# Patient Record
Sex: Female | Born: 2000 | Race: White | Hispanic: Yes | Marital: Single | Smoking: Never smoker
Health system: Southern US, Community
[De-identification: ages and names within clinical notes are randomized; demographics above are authoritative.]

## PROBLEM LIST (undated history)

## (undated) DIAGNOSIS — T7840XA Allergy, unspecified, initial encounter: Secondary | ICD-10-CM

## (undated) HISTORY — PX: TEAR DUCT PROBING: SHX793

## (undated) HISTORY — DX: Allergy, unspecified, initial encounter: T78.40XA

## (undated) HISTORY — PX: TYMPANOSTOMY TUBE PLACEMENT: SHX32

---
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
## 2024-03-08 NOTE — Progress Notes (Signed)
 [facility] [address] [place] [address] Dept: [phone] Dept Fax: [phone]  New Patient Office Visit  Subjective:   Diamond Garner 06/15/2001 03/09/2024  Chief Complaint  Patient presents with   Establish Care   Referral    Dermatology     HPI: Romelle Sthilaire presents today to establish care at [facility] at [facility]. Introduced to Publishing rights manager role and practice setting.  All questions answered.  Concerns: See below   Discussed the use of AI scribe software for clinical note transcription with the patient, who gave verbal consent to proceed.  History of Present Illness   Karalee Hauter is a 23 year old female who presents for establishment of care and dermatology referral.  She is transitioning from her pediatrician and has a history of eczema and a childhood allergy to penicillin, though she is unsure if this allergy persists.  She has irritable bowel syndrome (IBS) with diarrhea, ongoing since the COVID pandemic. Symptoms include diarrhea after eating, regardless of food type. Omeprazole  was previously prescribed but was ineffective, leading to discontinuation. She has tried a gluten-free diet and another unspecified diet without success. Despite these symptoms, she finds her IBS manageable and is able to maintain daily activities.  She seeks a dermatology referral for a rash that appears after consuming alcohol, specifically drinks with red components like vodka cranberry. The rash, described as red dots, appears primarily on her calves and occasionally on her thighs, emerging immediately after drinking and lasting about a week, fading like bruises. The rash does not itch and has been occurring since the summer of the previous year.  She is concerned about several moles, particularly one on her left hand, left arm, and right lateral neck, due to frequent use of tanning beds and sun  exposure. One mole on her left hand occasionally becomes sore and has scabbed in the past, though it does not bleed or itch.  She is currently not taking any medications.       The following portions of the patient's history were reviewed and updated as appropriate: past medical history, past surgical history, family history, social history, allergies, medications, and problem list.   Patient Active Problem List   Diagnosis Date Noted   Irritable bowel syndrome with diarrhea 03/09/2024   Eczema 11/14/2014   Past Medical History:  Diagnosis Date   Allergy    Phreesia 04/12/2020   Past Surgical History:  Procedure Laterality Date   TEAR DUCT PROBING     TYMPANOSTOMY TUBE PLACEMENT     History reviewed. No pertinent family history. No current outpatient medications on file. Allergies  Allergen Reactions   Penicillin G Other (See Comments)    As toddler    ROS: A complete ROS was performed with pertinent positives/negatives noted in the HPI. The remainder of the ROS are negative.   Objective:   Today's Vitals   03/09/24 0918  BP: 110/80  Pulse: 72  Temp: 98.2 F (36.8 C)  TempSrc: Temporal  SpO2: 98%  Weight: 133 lb 12.8 oz (60.7 kg)  Height: 5' (1.524 m)    GENERAL: Well-appearing, in NAD. Well nourished.  SKIN: Pink, warm and dry. Nevus to dorsal surface of left hand, left upper arm, and right lateral neck  RESPIRATORY: Chest wall symmetrical. Respirations even and non-labored. Breath sounds clear to auscultation bilaterally.  CARDIAC: S1, S2 present, regular rate and rhythm. Peripheral pulses 2+ bilaterally.  EXTREMITIES: Without clubbing, cyanosis, or edema.  NEUROLOGIC:  Steady, even gait.  PSYCH/MENTAL STATUS: Alert, oriented x 3. Cooperative, appropriate mood and affect.   Health Maintenance Due  Topic Date Due   HIV Screening  Never done   Meningococcal B Vaccine (1 of 2 - Standard) Never done   Hepatitis C Screening  Never done    No results found  for any visits on 03/09/24.  Assessment & Plan:  Assessment and Plan    Irritable Bowel Syndrome with Diarrhea Chronic IBS with diarrhea. Symptoms persist despite omeprazole  and gluten-free diet. Discussed FODMAP diet to identify triggers. - Continue dietary modifications. - Avoid known trigger foods. - Consider trial of FODMAP diet.  Rash and Nonspecific Skin Eruption  Recurrent rash after consuming alcoholic mixed beverages, particularly with red ingredients. Dermatology referral made.  - Follow up with dermatology for evaluation.  Numerous Moles  Dermatology referral made for evaluation and excision. Emphasize monitoring for changes and minimizing tanning bed use. - Follow up with dermatology for evaluation and possible excision.  Eczema - stable    Orders Placed This Encounter  Procedures   Ambulatory referral to Dermatology    Referral Priority:   Routine    Referral Type:   Consultation    Referral Reason:   Specialty Services Required    Requested Specialty:   Dermatology    Number of Visits Requested:   1   No orders of the defined types were placed in this encounter.   Return in about 6 months (around 09/08/2024) for Annual Physical Exam with fasting lab work.   Gavin Kast, FNP

## 2024-03-09 ENCOUNTER — Ambulatory Visit: Admitting: Internal Medicine

## 2024-03-09 ENCOUNTER — Encounter: Payer: Self-pay | Admitting: Internal Medicine

## 2024-03-09 VITALS — BP 110/80 | HR 72 | Temp 98.2°F | Ht 60.0 in | Wt 133.8 lb

## 2024-03-09 DIAGNOSIS — R21 Rash and other nonspecific skin eruption: Secondary | ICD-10-CM

## 2024-03-09 DIAGNOSIS — K580 Irritable bowel syndrome with diarrhea: Secondary | ICD-10-CM

## 2024-03-09 DIAGNOSIS — L309 Dermatitis, unspecified: Secondary | ICD-10-CM

## 2024-03-09 DIAGNOSIS — D229 Melanocytic nevi, unspecified: Secondary | ICD-10-CM | POA: Diagnosis not present

## 2024-03-09 NOTE — Patient Instructions (Addendum)
 [facility] Dermatology Locations: [address], [place], [place] [address] Phone: [phone]   [address] [place], [place] [address] Phone: [phone]

## 2024-05-03 ENCOUNTER — Ambulatory Visit: Payer: Self-pay

## 2024-05-03 NOTE — Telephone Encounter (Signed)
 FYI Only or Action Required?: FYI only for provider.  Patient was last seen in primary care on 03/09/2024 by Billy Knee, FNP.  Called Nurse Triage reporting Back Pain.  Symptoms began today.  Interventions attempted: Rest, hydration, or home remedies.  Symptoms are: gradually worsening.  Triage Disposition: See HCP Within 4 Hours (Or PCP Triage)  Patient/caregiver understands and will follow disposition?: Yes, will follow disposition  Copied from CRM #[id number]. Topic: Clinical - Red Word Triage >> May 03, 2024  3:43 PM Deleta RAMAN wrote: Red Word that prompted transfer to Nurse Triage: patient has back pain that travels to her front just occurred on 8/6 Reason for Disposition  [1] SEVERE back pain (e.g., excruciating, unable to do any normal activities) AND [2] not improved 2 hours after pain medicine  Answer Assessment - Initial Assessment Questions 1. ONSET: When did the pain begin? (e.g., minutes, hours, days)     About an hour ago 2. LOCATION: Where does it hurt? (upper, mid or lower back)     L lower back and L side pain 3. SEVERITY: How bad is the pain?  (e.g., Scale 1-10; mild, moderate, or severe)     7 4. PATTERN: Is the pain constant? (e.g., yes, no; constant, intermittent)      constant 5. RADIATION: Does the pain shoot into your legs or somewhere else?     denies 6. CAUSE:  What do you think is causing the back pain?      denies 7. BACK OVERUSE:  Any recent lifting of heavy objects, strenuous work or exercise?     denies 8. MEDICINES: What have you taken so far for the pain? (e.g., nothing, acetaminophen, NSAIDS)     Denies having tried tylenol and ibuprofen  9. NEUROLOGIC SYMPTOMS: Do you have any weakness, numbness, or problems with bowel/bladder control?     denies 10. OTHER SYMPTOMS: Do you have any other symptoms? (e.g., fever, abdomen pain, burning with urination, blood in urine)       Denies,  11. PREGNANCY: Is there any chance you are  pregnant? When was your last menstrual period?       Possibly, LMP 7/27-7/29  Protocols used: Back Pain-A-AH

## 2024-05-04 ENCOUNTER — Encounter: Payer: Self-pay | Admitting: Family Medicine

## 2024-05-04 ENCOUNTER — Ambulatory Visit (HOSPITAL_BASED_OUTPATIENT_CLINIC_OR_DEPARTMENT_OTHER)
Admission: RE | Admit: 2024-05-04 | Discharge: 2024-05-04 | Disposition: A | Discharge status: Home / Self Care | Source: Ambulatory Visit | Attending: Family Medicine | Admitting: Family Medicine

## 2024-05-04 ENCOUNTER — Ambulatory Visit: Payer: Self-pay | Admitting: Family Medicine

## 2024-05-04 ENCOUNTER — Ambulatory Visit (INDEPENDENT_AMBULATORY_CARE_PROVIDER_SITE_OTHER): Admitting: Family Medicine

## 2024-05-04 VITALS — BP 124/86 | HR 77 | Temp 98.0°F | Resp 18 | Wt 132.2 lb

## 2024-05-04 DIAGNOSIS — R1032 Left lower quadrant pain: Secondary | ICD-10-CM | POA: Diagnosis present

## 2024-05-04 DIAGNOSIS — N200 Calculus of kidney: Secondary | ICD-10-CM | POA: Insufficient documentation

## 2024-05-04 DIAGNOSIS — M545 Low back pain, unspecified: Secondary | ICD-10-CM | POA: Insufficient documentation

## 2024-05-04 DIAGNOSIS — R399 Unspecified symptoms and signs involving the genitourinary system: Secondary | ICD-10-CM

## 2024-05-04 LAB — POC URINALSYSI DIPSTICK (AUTOMATED)
Bilirubin, UA: NEGATIVE
Glucose, UA: NEGATIVE
Leukocytes, UA: NEGATIVE
Nitrite, UA: NEGATIVE
Protein, UA: POSITIVE — AB
Spec Grav, UA: >=1.03 — AB (ref 1.010–1.025)
Urobilinogen, UA: 2 U/dL — AB
pH, UA: 6 (ref 5.0–8.0)

## 2024-05-04 LAB — POCT URINE PREGNANCY: Preg Test, Ur: NEGATIVE

## 2024-05-04 MED ORDER — TAMSULOSIN HCL 0.4 MG PO CAPS: 0.4000 mg | ORAL_CAPSULE | Freq: Every day | ORAL | 3 refills | Status: DC

## 2024-05-04 MED ORDER — IBUPROFEN 800 MG PO TABS: 800.0000 mg | ORAL_TABLET | Freq: Three times a day (TID) | ORAL | 0 refills | Status: DC | PRN

## 2024-05-04 NOTE — Patient Instructions (Signed)
  VISIT SUMMARY: Today, you visited us  due to severe left-sided back and flank pain that has been ongoing for the past two days. You initially sought care at urgent care, where you received some pain relief and were prescribed medications. We discussed your symptoms and performed a urine test, which showed blood but no signs of infection. We suspect that you may have a kidney stone causing your pain.  YOUR PLAN: -LEFT FLANK AND BACK PAIN, POSSIBLE NEPHROLITHIASIS WITH HEMATURIA: Your severe left-sided back and flank pain is likely due to nephrolithiasis, which means you may have a kidney stone. Kidney stones can cause significant pain and blood in the urine. We have ordered a CT scan of your abdomen and pelvis to confirm this. In the meantime, we have prescribed tamsulosin  to help the stone pass, ibuprofen  for pain relief, and hydrocodone for severe pain. Please increase your fluid intake to help pass the stone. We have also provided a work note for five days off to allow you to recover. If your symptoms persist beyond five days or worsen, please follow up with us .  INSTRUCTIONS: Please get a CT scan of your abdomen and pelvis as soon as possible to check for kidney stones. Take tamsulosin  0.4 mg once daily to help pass the stone, and use ibuprofen  800 mg every 8 hours as needed for pain. Continue taking hydrocodone for severe pain if necessary. Drink plenty of fluids to aid in stone passage. Rest and take the next five days off work as per the work note provided. Follow up with us  if your symptoms persist beyond five days or if your condition worsens.

## 2024-05-04 NOTE — Progress Notes (Signed)
 Assessment & Plan   Assessment/Plan:    Assessment & Plan Left flank and back pain, possible nephrolithiasis with hematuria Acute left-sided flank and back pain for two days, likely due to nephrolithiasis. Hematuria with 2+ red blood cells in urine, no esterase or nitrites, suggests non-infectious etiology. Differential diagnosis includes nephrolithiasis, given hematuria and characteristic pain. Pain is severe, requiring hydrocodone. Urine pregnancy test is negative, ruling out pregnancy-related causes. No fever, vomiting, or other signs of infection. - Order CT scan of the abdomen and pelvis to evaluate for nephrolithiasis. - Prescribe tamsulosin  0.4 mg once daily to facilitate stone passage. - Encourage increased fluid intake to aid in stone passage. - Prescribe ibuprofen  800 mg every 8 hours as needed for pain. - Continue hydrocodone for severe pain management as needed. - Provide a work note for five days off to allow for recovery and stone passage. - Advise follow-up if symptoms persist beyond five days or if condition worsens.      There are no discontinued medications.  Return if symptoms worsen or fail to improve.        Subjective:   Encounter date: 05/04/2024  Diamond Garner is a 23 y.o. female who has Eczema; Irritable bowel syndrome with diarrhea; Acute left-sided low back pain without sciatica; and Urinary tract infection symptoms on their problem list..   She  has a past medical history of Allergy..   She presents with chief complaint of Nephrolithiasis (Pt c/o left side pain and back pain. Pt was prescribed hydrocodone and zofran for symptoms while at Urgent Care on 05/03/2024//HM due- vaccinations. ) .   Discussed the use of AI scribe software for clinical note transcription with the patient, who gave verbal consent to proceed.  History of Present Illness Diamond Garner is a 23 year old female who presents with left-sided back and flank pain. She is  accompanied by her mother.  She has been experiencing left-sided back and flank pain for the past two days. The pain began after work and was initially mild but worsened significantly, prompting her to visit urgent care. She describes the pain as severe, requiring her to lie down with a heating pad and eventually seek medical attention.  At urgent care, she received an injection, possibly of lidocaine and ketorolac, which provided some relief. She was prescribed hydrocodone for pain and Zofran for nausea. She took her first dose of hydrocodone around 6 or 7 PM, which helped reduce the pain but did not eliminate it. She woke up at 4 AM the following morning with pain similar to the initial onset and took another dose of hydrocodone. She has not taken any more pain medication since 4 AM.  A urine test at urgent care showed 2+ red blood cells but no esterase or nitrites. A urine pregnancy test was negative. She reports nausea, which she attributes to straining from the pain. No fever, vomiting, vaginal bleeding, discharge, blood in stool, or constipation.   Patient's last menstrual period was 04/23/2024 (exact date).   ROS  Past Surgical History:  Procedure Laterality Date   TEAR DUCT PROBING     TYMPANOSTOMY TUBE PLACEMENT      Outpatient Medications Prior to Visit  Medication Sig Dispense Refill   HYDROcodone-acetaminophen (NORCO/VICODIN) 5-325 MG tablet Take 1 tablet by mouth every 6 (six) hours as needed for moderate pain (pain score 4-6).     ondansetron (ZOFRAN-ODT) 4 MG disintegrating tablet Take 2 mg by mouth every 6 (six) hours as needed.  No facility-administered medications prior to visit.    Family History  Problem Relation Age of Onset   Anxiety disorder Maternal Grandmother    Arthritis Maternal Grandmother     Social History   Socioeconomic History   Marital status: Single    Spouse name: Not on file   Number of children: Not on file   Years of education: Not on  file   Highest education level: Some college, no degree  Occupational History   Not on file  Tobacco Use   Smoking status: Never   Smokeless tobacco: Never  Substance and Sexual Activity   Alcohol use: Not on file   Drug use: Not on file   Sexual activity: Not on file  Other Topics Concern   Not on file  Social History Narrative   11th grade at [employer] 21-22 school year. Lives with mom, step-dad, older sister, 2 dogs, 1 cat (mostly outdoor)   Social Drivers of Corporate investment banker Strain: Low Risk  (03/05/2024)   Overall Financial Resource Strain (CARDIA)    Difficulty of Paying Living Expenses: Not very hard  Food Insecurity: No Food Insecurity (03/05/2024)   Hunger Vital Sign    Worried About Running Out of Food in the Last Year: Never true    Ran Out of Food in the Last Year: Never true  Transportation Needs: No Transportation Needs (03/05/2024)   PRAPARE - Administrator, Civil Service (Medical): No    Lack of Transportation (Non-Medical): No  Physical Activity: Sufficiently Active (03/05/2024)   Exercise Vital Sign    Days of Exercise per Week: 5 days    Minutes of Exercise per Session: 60 min  Stress: No Stress Concern Present (03/05/2024)   [employer] of Occupational Health - Occupational Stress Questionnaire    Feeling of Stress : Only a little  Social Connections: Unknown (03/05/2024)   Social Connection and Isolation Panel    Frequency of Communication with Friends and Family: More than three times a week    Frequency of Social Gatherings with Friends and Family: Three times a week    Attends Religious Services: More than 4 times per year    Active Member of Clubs or Organizations: Yes    Attends Engineer, structural: More than 4 times per year    Marital Status: Patient declined  Intimate Partner Violence: Not on file                                                                                                   Objective:  Physical Exam: BP 124/86 (BP Location: Left Arm, Patient Position: Sitting, Cuff Size: Normal)   Pulse 77   Temp 98 F (36.7 C) (Temporal)   Resp 18   Wt 132 lb 3.2 oz (60 kg)   LMP 04/23/2024 (Exact Date)   SpO2 100%   BMI 25.82 kg/m   Wt Readings from Last 3 Encounters:  05/04/24 132 lb 3.2 oz (60 kg)  03/09/24 133 lb 12.8 oz (60.7 kg)  01/13/21 136 lb (61.7 kg) (72%, Z=  0.59)*   * Growth percentiles are based on CDC (Girls, 2-20 Years) data.    Physical Exam GENERAL: Alert, cooperative, well developed, no acute distress. HEENT: Normocephalic, normal oropharynx, moist mucous membranes. CHEST: Clear to auscultation bilaterally, no wheezes, rhonchi, or crackles. CARDIOVASCULAR: Normal heart rate and rhythm, S1 and S2 normal without murmurs. ABDOMEN: Soft, non-tender, non-distended, without organomegaly, normal bowel sounds, no costovertebral angle tenderness. EXTREMITIES: No cyanosis or edema. NEUROLOGICAL: Cranial nerves grossly intact, moves all extremities without gross motor or sensory deficit.   Physical Exam  No results found.  Recent Results (from the past 2160 hours)  POCT urine pregnancy     Status: Normal   Collection Time: 05/04/24 10:49 AM  Result Value Ref Range   Preg Test, Ur Negative Negative  POCT Urinalysis Dipstick (Automated)     Status: Abnormal   Collection Time: 05/04/24 10:50 AM  Result Value Ref Range   Color, UA yellow    Clarity, UA clear    Glucose, UA Negative Negative   Bilirubin, UA NEGATIVE    Ketones, UA POSTIVE    Spec Grav, UA >=1.030 (A) 1.010 - 1.025   Blood, UA POSTIVE    pH, UA 6.0 5.0 - 8.0   Protein, UA Positive (A) Negative   Urobilinogen, UA 2.0 (A) 0.2 or 1.0 E.U./dL   Nitrite, UA NEGATIVE    Leukocytes, UA Negative Negative        Beverley Adine Hummer, MD, MS

## 2024-05-05 ENCOUNTER — Ambulatory Visit: Admitting: Internal Medicine

## 2024-05-05 LAB — URINALYSIS, ROUTINE W REFLEX MICROSCOPIC
Bilirubin Urine: NEGATIVE
Leukocytes,Ua: NEGATIVE
Nitrite: NEGATIVE
Specific Gravity, Urine: 1.025 (ref 1.000–1.030)
Urine Glucose: NEGATIVE
Urobilinogen, UA: 0.2 (ref 0.0–1.0)
pH: 6 (ref 5.0–8.0)

## 2024-05-05 LAB — URINE CULTURE
MICRO NUMBER:: 16800877
SPECIMEN QUALITY:: ADEQUATE

## 2024-05-09 ENCOUNTER — Telehealth: Payer: Self-pay

## 2024-05-09 DIAGNOSIS — N200 Calculus of kidney: Secondary | ICD-10-CM

## 2024-05-09 NOTE — Telephone Encounter (Signed)
 Please advise   Copied from CRM [id number]. Topic: Clinical - Medical Advice >> May 09, 2024  1:52 PM Diamond Garner wrote: Reason for CRM: Patient would  Like to know what would be the next steps as far as the kidney stone that she has.She don't think she has passed it yet.She said she has some pain here and there but nothing extremely painful.

## 2024-05-10 NOTE — Addendum Note (Signed)
 Addended by: SEBASTIAN RIGHTER B on: 05/10/2024 01:20 PM   Modules accepted: Orders

## 2024-05-11 NOTE — Telephone Encounter (Signed)
 Noted. MyChart message was sent

## 2024-05-16 ENCOUNTER — Encounter: Payer: Self-pay | Admitting: Internal Medicine

## 2024-05-16 ENCOUNTER — Ambulatory Visit (INDEPENDENT_AMBULATORY_CARE_PROVIDER_SITE_OTHER): Admitting: Internal Medicine

## 2024-05-16 VITALS — BP 106/72 | HR 60 | Temp 98.4°F | Ht 60.0 in | Wt 132.4 lb

## 2024-05-16 DIAGNOSIS — F419 Anxiety disorder, unspecified: Secondary | ICD-10-CM | POA: Diagnosis not present

## 2024-05-16 DIAGNOSIS — Z0000 Encounter for general adult medical examination without abnormal findings: Secondary | ICD-10-CM | POA: Diagnosis not present

## 2024-05-16 DIAGNOSIS — L709 Acne, unspecified: Secondary | ICD-10-CM | POA: Diagnosis not present

## 2024-05-16 DIAGNOSIS — Z1329 Encounter for screening for other suspected endocrine disorder: Secondary | ICD-10-CM | POA: Diagnosis not present

## 2024-05-16 DIAGNOSIS — F32A Depression, unspecified: Secondary | ICD-10-CM | POA: Diagnosis not present

## 2024-05-16 DIAGNOSIS — Z13220 Encounter for screening for lipoid disorders: Secondary | ICD-10-CM

## 2024-05-16 LAB — LIPID PANEL
Cholesterol: 151 mg/dL (ref 0–200)
HDL: 62 mg/dL (ref >39.00)
LDL Cholesterol: 81 mg/dL (ref 0–99)
NonHDL: 88.73
Total CHOL/HDL Ratio: 2
Triglycerides: 38 mg/dL (ref 0.0–149.0)
VLDL: 7.6 mg/dL (ref 0.0–40.0)

## 2024-05-16 LAB — BASIC METABOLIC PANEL WITH GFR
BUN: 16 mg/dL (ref 6–23)
CO2: 24 meq/L (ref 19–32)
Calcium: 9.7 mg/dL (ref 8.4–10.5)
Chloride: 101 meq/L (ref 96–112)
Creatinine, Ser: 0.67 mg/dL (ref 0.40–1.20)
GFR: 125.53 mL/min (ref >60.00)
Glucose, Bld: 68 mg/dL — ABNORMAL LOW (ref 70–99)
Potassium: 3.9 meq/L (ref 3.5–5.1)
Sodium: 135 meq/L (ref 135–145)

## 2024-05-16 LAB — TSH: TSH: 0.26 u[IU]/mL — ABNORMAL LOW (ref 0.35–5.50)

## 2024-05-16 MED ORDER — SERTRALINE HCL 25 MG PO TABS: 25.0000 mg | ORAL_TABLET | Freq: Every day | ORAL | 0 refills | Status: DC

## 2024-05-16 MED ORDER — TRETINOIN 0.025 % EX CREA: TOPICAL_CREAM | Freq: Every day | CUTANEOUS | 0 refills | Status: DC

## 2024-05-16 NOTE — Progress Notes (Signed)
 Subjective:   Diamond Garner April 26, 2001  05/16/2024   CC: Chief Complaint  Patient presents with   Annual Exam    Fasting     HPI: Diamond Garner is a 23 y.o. female who presents for a routine health maintenance exam.  Labs  collected at time of visit.  She is going to school at [employer] for psychology, starting junior year.    ACNE: Duration: years Acne problem areas:forehead, chin, and cheeks  Worst area:cheeks  Aggravating factors: stress and menses Current face care: using plain soap bar Current acne medications: none Previous acne medications: none Acne status: exacerbated Previous dermatology evaluation: no   ANXIETY: Diamond Garner presents to discuss anxiety.  Has noticed more recently with working and starting going back to school. Reports noticing no motivation. Becomes sad and lonely. Lives with sister, but does not see her frequently. Reports trouble sleeping -falling asleep and then sleeping too much. No change in appetite.  Does report having panic attacks - none recently, but has occurred in past.  Current medication regimen: none  Counseling: no Well controlled: no Denies SI/HI.      05/16/2024    1:10 PM 03/09/2024    9:34 AM  GAD 7 : Generalized Anxiety Score  Nervous, Anxious, on Edge 3 2  Control/stop worrying 2 2  Worry too much - different things 2 2  Trouble relaxing 0 0  Restless 0 0  Easily annoyed or irritable 1 0  Afraid - awful might happen 1 1  Total GAD 7 Score 9 7  Anxiety Difficulty Very difficult Somewhat difficult      05/16/2024    1:10 PM 05/04/2024   10:48 AM 03/09/2024    9:34 AM  PHQ9 SCORE ONLY  PHQ-9 Total Score 3 0 2     HEALTH SCREENINGS: - Pap smear: not applicable - Mammogram (40+): Not applicable  - Colonoscopy (45+): Not applicable  - Bone Density (65+): Not applicable  - Lung CA screening with low-dose CT:  Not applicable Adults age 3-80 who are current cigarette smokers or quit within the last  15 years. Must have 20 pack year history.   IMMUNIZATIONS: - Tdap: Tetanus vaccination status reviewed: last tetanus booster within 10 years. - HPV: Up to date - Influenza: Postponed to flu season - Prevnar 20: Not applicable - Zostavax (50+): Not applicable   Past medical history, surgical history, medications, allergies, family history and social history reviewed with patient today and changes made to appropriate areas of the chart.   Social History   Socioeconomic History   Marital status: Single    Spouse name: Not on file   Number of children: Not on file   Years of education: Not on file   Highest education level: Some college, no degree  Occupational History   Not on file  Tobacco Use   Smoking status: Never   Smokeless tobacco: Never  Vaping Use   Vaping status: Never Used  Substance and Sexual Activity   Alcohol use: Yes    Comment: occ   Drug use: Never   Sexual activity: Never  Other Topics Concern   Not on file  Social History Narrative   11th grade at [employer] 21-22 school year. Lives with mom, step-dad, older sister, 2 dogs, 1 cat (mostly outdoor)   Social Drivers of Corporate investment banker Strain: Low Risk  (05/15/2024)   Overall Financial Resource Strain (CARDIA)    Difficulty of Paying Living Expenses: Not  very hard  Food Insecurity: No Food Insecurity (05/15/2024)   Hunger Vital Sign    Worried About Running Out of Food in the Last Year: Never true    Ran Out of Food in the Last Year: Never true  Transportation Needs: No Transportation Needs (05/15/2024)   PRAPARE - Administrator, Civil Service (Medical): No    Lack of Transportation (Non-Medical): No  Physical Activity: Unknown (05/15/2024)   Exercise Vital Sign    Days of Exercise per Week: 5 days    Minutes of Exercise per Session: Patient declined  Stress: Stress Concern Present (05/15/2024)   [employer] of Occupational Health - Occupational Stress  Questionnaire    Feeling of Stress: Rather much  Social Connections: Unknown (05/15/2024)   Social Connection and Isolation Panel    Frequency of Communication with Friends and Family: More than three times a week    Frequency of Social Gatherings with Friends and Family: Twice a week    Attends Religious Services: More than 4 times per year    Active Member of [employer] or Organizations: Yes    Attends Banker Meetings: More than 4 times per year    Marital Status: Patient declined  Intimate Partner Violence: Not on file     Past Medical History:  Diagnosis Date   Allergy    Phreesia 04/12/2020    Past Surgical History:  Procedure Laterality Date   TEAR DUCT PROBING     TYMPANOSTOMY TUBE PLACEMENT      Current Outpatient Medications on File Prior to Visit  Medication Sig   ibuprofen  (ADVIL ) 800 MG tablet Take 1 tablet (800 mg total) by mouth every 8 (eight) hours as needed.   ondansetron (ZOFRAN-ODT) 4 MG disintegrating tablet Take 2 mg by mouth every 6 (six) hours as needed.   No current facility-administered medications on file prior to visit.    Allergies  Allergen Reactions   Penicillin G Other (See Comments)    As toddler    Family History  Problem Relation Age of Onset   Anxiety disorder Maternal Grandmother    Arthritis Maternal Grandmother      ROS: Denies fever, fatigue, unexplained weight loss/gain, hearing or vision changes, cardiac or respiratory complaints. Denies neurological deficits, musculoskeletal complaints, gastrointestinal or genitourinary complaints.  Objective:   Today's Vitals   05/16/24 1308  BP: 106/72  Pulse: 60  Temp: 98.4 F (36.9 C)  TempSrc: Temporal  SpO2: 97%  Weight: 132 lb 6.4 oz (60.1 kg)  Height: 5' (1.524 m)    GENERAL APPEARANCE: Well-appearing, in NAD. Well nourished.  SKIN: Pink, warm and dry. Turgor normal. No rash, lesion, ulceration, or ecchymoses. Hair evenly distributed. Open and closed comedones to  chin, cheeks, forehead HEENT: HEAD: Normocephalic.  EYES: PERRLA. EOMI. Lids intact w/o defect. Sclera white, Conjunctiva pink w/o exudate.  EARS: External ear w/o redness, swelling, masses or lesions. EAC clear. TM's intact, translucent w/o bulging, appropriate landmarks visualized. Appropriate acuity to conversational tones.  NOSE: Septum midline w/o deformity. Nares patent, mucosa pink and non-inflamed w/o drainage.  THROAT: Uvula midline. Oropharynx clear. Tonsils non-inflamed w/o exudate . Oral mucosa pink and moist.  NECK: Supple, Trachea midline. Full ROM w/o pain or tenderness. No lymphadenopathy. Thyroid  non-tender w/o enlargement or palpable masses.  BREASTS: Breasts pendulous, symmetrical, and w/o palpable masses. Nipples everted and w/o discharge. No rash or skin retraction. No axillary or supraclavicular lymphadenopathy.  RESPIRATORY: Chest wall symmetrical w/o masses. Respirations even and non-labored.  Breath sounds clear to auscultation bilaterally. No wheezes, rales, rhonchi, or crackles. CARDIAC: S1, S2 present, regular rate and rhythm. No gallops, murmurs, rubs, or clicks. Capillary refill <2 seconds. Peripheral pulses 2+ bilaterally. GI: Abdomen soft w/o distention. Normoactive bowel sounds. No palpable masses or tenderness. No guarding or rebound tenderness. Liver and spleen w/o tenderness or enlargement. No CVA tenderness.  MSK: Muscle tone and strength appropriate for age, w/o atrophy or abnormal movement.  EXTREMITIES: Active ROM intact, w/o tenderness, crepitus, or contracture. No obvious joint deformities or effusions. No clubbing, edema, or cyanosis.  NEUROLOGIC: CN's II-XII intact. Motor strength symmetrical with no obvious weakness. No sensory deficits. Steady, even gait.  PSYCH/MENTAL STATUS: Alert, oriented x 3. Cooperative, appropriate mood and affect.    Depression and Anxiety Screen done today and results listed below:     05/16/2024    1:10 PM 05/04/2024   10:48  AM 03/09/2024    9:34 AM  Depression screen PHQ 2/9  Decreased Interest 0 0 0  Down, Depressed, Hopeless 1 0 0  PHQ - 2 Score 1 0 0  Altered sleeping 1  1  Tired, decreased energy 1  1  Change in appetite 0  0  Feeling bad or failure about yourself  0  0  Trouble concentrating 0  0  Moving slowly or fidgety/restless 0  0  Suicidal thoughts 0  0  PHQ-9 Score 3  2  Difficult doing work/chores Somewhat difficult  Not difficult at all      05/16/2024    1:10 PM 03/09/2024    9:34 AM  GAD 7 : Generalized Anxiety Score  Nervous, Anxious, on Edge 3 2  Control/stop worrying 2 2  Worry too much - different things 2 2  Trouble relaxing 0 0  Restless 0 0  Easily annoyed or irritable 1 0  Afraid - awful might happen 1 1  Total GAD 7 Score 9 7  Anxiety Difficulty Very difficult Somewhat difficult      Assessment & Plan:  Annual physical exam  Screening for thyroid  disorder -     TSH -     Basic metabolic panel with GFR  Screening for lipid disorders -     Lipid panel  Acne, unspecified acne type -     Tretinoin ; Apply topically at bedtime.  Dispense: 45 g; Refill: 0 - discussed starting benzoyl peroxide wash  Anxiety and depression -     Sertraline  HCl; Take 1 tablet (25 mg total) by mouth daily.  Dispense: 90 tablet; Refill: 0 - will increase based on tolerance and response to medication    Orders Placed This Encounter  Procedures   TSH   Basic Metabolic Panel (BMET)   Lipid panel    PATIENT COUNSELING:  - Encouraged a healthy well-balanced diet. Patient may adjust caloric intake to maintain or achieve ideal body weight. May reduce intake of dietary saturated fat and total fat and have adequate dietary potassium and calcium preferably from fresh fruits, vegetables, and low-fat dairy products.    - Stressed the importance of regular exercise   NEXT PREVENTATIVE PHYSICAL DUE IN 1 YEAR.  Return in about 4 weeks (around 06/13/2024) for Anxiety/Depression.  Rosina Senters, FNP

## 2024-05-16 NOTE — Patient Instructions (Signed)
 Acne - Advised the patient to use a face wash that contains Benzoyl peroxide - can start using it every other day - Prescribed Retin-A  (tretinoin ) cream 0.025% and instructed the patient to use it once day at night before bed  - Benzoyl peroxide can stain the towels and sheets, so advised that they use the same towels and pillowcases to avoid discoloring too many items - Warned the patient that Retin-A  can dry out the skin and that they may need to use a moisturizing cream if any small areas of dry skin develop

## 2024-05-18 ENCOUNTER — Ambulatory Visit: Payer: Self-pay | Admitting: Internal Medicine

## 2024-05-18 DIAGNOSIS — R7989 Other specified abnormal findings of blood chemistry: Secondary | ICD-10-CM

## 2024-06-08 ENCOUNTER — Ambulatory Visit: Admitting: Urology

## 2024-06-15 NOTE — Progress Notes (Unsigned)
  [facility] [address] [place] [address] Dept: [phone] Dept Fax: [phone]    Subjective:   Diamond Garner 10/28/2000 06/16/2024  No chief complaint on file.   HPI: Diamond Garner presents today for re-assessment and management of chronic medical conditions.     The following portions of the patient's history were reviewed and updated as appropriate: past medical history, past surgical history, family history, social history, allergies, medications, and problem list.   Patient Active Problem List   Diagnosis Date Noted   Acne 05/16/2024   Anxiety and depression 05/16/2024   Acute left-sided low back pain without sciatica 05/04/2024   Urinary tract infection symptoms 05/04/2024   Nephrolithiasis 05/04/2024   Irritable bowel syndrome with diarrhea 03/09/2024   Eczema 11/14/2014   Past Medical History:  Diagnosis Date   Allergy    Phreesia 04/12/2020   Past Surgical History:  Procedure Laterality Date   TEAR DUCT PROBING     TYMPANOSTOMY TUBE PLACEMENT     Family History  Problem Relation Age of Onset   Anxiety disorder Maternal Grandmother    Arthritis Maternal Grandmother     Current Outpatient Medications:    ibuprofen  (ADVIL ) 800 MG tablet, Take 1 tablet (800 mg total) by mouth every 8 (eight) hours as needed., Disp: 30 tablet, Rfl: 0   ondansetron (ZOFRAN-ODT) 4 MG disintegrating tablet, Take 2 mg by mouth every 6 (six) hours as needed., Disp: , Rfl:    sertraline  (ZOLOFT ) 25 MG tablet, Take 1 tablet (25 mg total) by mouth daily., Disp: 90 tablet, Rfl: 0   tretinoin  (RETIN-A ) 0.025 % cream, Apply topically at bedtime., Disp: 45 g, Rfl: 0 Allergies  Allergen Reactions   Penicillin G Other (See Comments)    As toddler     ROS: A complete ROS was performed with pertinent positives/negatives noted in the HPI. The remainder of the ROS are negative.    Objective:   There were no vitals  filed for this visit.  GENERAL: Well-appearing, in NAD. Well nourished.  SKIN: Pink, warm and dry. No rash, lesion, ulceration, or ecchymoses.  NECK: Trachea midline. Full ROM w/o pain or tenderness. No lymphadenopathy.  RESPIRATORY: Chest wall symmetrical. Respirations even and non-labored. Breath sounds clear to auscultation bilaterally.  CARDIAC: S1, S2 present, regular rate and rhythm. Peripheral pulses 2+ bilaterally.  MSK: Muscle tone and strength appropriate for age. Joints w/o tenderness, redness, or swelling.  EXTREMITIES: Without clubbing, cyanosis, or edema.  NEUROLOGIC: No motor or sensory deficits. Steady, even gait.  PSYCH/MENTAL STATUS: Alert, oriented x 3. Cooperative, appropriate mood and affect.   Health Maintenance Due  Topic Date Due   COVID-19 Vaccine (1) Never done    No results found for any visits on 06/16/24.  The ASCVD Risk score (Arnett DK, et al., 2019) failed to calculate for the following reasons:   The 2019 ASCVD risk score is only valid for ages 37 to 22     Assessment & Plan:    There are no diagnoses linked to this encounter. No orders of the defined types were placed in this encounter.  No images are attached to the encounter or orders placed in the encounter. No orders of the defined types were placed in this encounter.   No follow-ups on file.   Rosina Senters, FNP

## 2024-06-16 ENCOUNTER — Encounter: Payer: Self-pay | Admitting: Internal Medicine

## 2024-06-16 ENCOUNTER — Ambulatory Visit (INDEPENDENT_AMBULATORY_CARE_PROVIDER_SITE_OTHER): Payer: Self-pay | Admitting: Internal Medicine

## 2024-06-16 VITALS — BP 106/72 | HR 72 | Temp 98.4°F | Ht 60.0 in | Wt 129.8 lb

## 2024-06-16 DIAGNOSIS — L709 Acne, unspecified: Secondary | ICD-10-CM | POA: Diagnosis not present

## 2024-06-16 DIAGNOSIS — F419 Anxiety disorder, unspecified: Secondary | ICD-10-CM

## 2024-06-16 DIAGNOSIS — R7989 Other specified abnormal findings of blood chemistry: Secondary | ICD-10-CM

## 2024-06-16 DIAGNOSIS — F32A Depression, unspecified: Secondary | ICD-10-CM

## 2024-06-16 MED ORDER — MINOCYCLINE HCL 100 MG PO CAPS: 100.0000 mg | ORAL_CAPSULE | Freq: Two times a day (BID) | ORAL | 0 refills | Status: AC

## 2024-06-16 MED ORDER — SERTRALINE HCL 25 MG PO TABS: 50.0000 mg | ORAL_TABLET | Freq: Every day | ORAL | Status: DC

## 2024-06-17 LAB — TSH RFX ON ABNORMAL TO FREE T4: TSH: 0.763 u[IU]/mL (ref 0.450–4.500)

## 2024-06-19 ENCOUNTER — Ambulatory Visit: Payer: Self-pay | Admitting: Internal Medicine

## 2024-06-19 ENCOUNTER — Other Ambulatory Visit: Payer: Self-pay | Admitting: Internal Medicine

## 2024-06-19 DIAGNOSIS — L709 Acne, unspecified: Secondary | ICD-10-CM

## 2024-07-26 ENCOUNTER — Other Ambulatory Visit: Payer: Self-pay | Admitting: Internal Medicine

## 2024-07-26 DIAGNOSIS — F419 Anxiety disorder, unspecified: Secondary | ICD-10-CM

## 2024-07-26 MED ORDER — SERTRALINE HCL 25 MG PO TABS: 50.0000 mg | ORAL_TABLET | Freq: Every day | ORAL | 1 refills | Status: AC

## 2024-07-26 NOTE — Telephone Encounter (Signed)
 Copied from CRM [id number]. Topic: Clinical - Medication Refill >> Jul 26, 2024  2:46 PM Dedra B wrote: Medication: sertraline  (ZOLOFT ) 25 MG tablet  Has the patient contacted their pharmacy? No refills   This is the patient's preferred pharmacy:  [facility] [address], [place] - [address] [place] [address] Phone: [phone] Fax: [phone]  Is this the correct pharmacy for this prescription? Yes   Has the prescription been filled recently? No  Is the patient out of the medication? No, 6 left take 2 a day  Has the patient been seen for an appointment in the last year OR does the patient have an upcoming appointment? Yes  Can we respond through MyChart? Yes  Agent: Please be advised that Rx refills may take up to 3 business days. We ask that you follow-up with your pharmacy.

## 2024-07-26 NOTE — Telephone Encounter (Signed)
 Requesting: SERTRALINE  HCL 25 MG PO TABS Last Visit: 06/16/2024 Next Visit: 08/30/2024 Last Refill: 06/16/2024  Please Advise

## 2024-07-28 ENCOUNTER — Ambulatory Visit: Admitting: Internal Medicine

## 2024-08-30 ENCOUNTER — Ambulatory Visit: Admitting: Internal Medicine

## 2024-09-06 ENCOUNTER — Encounter: Admitting: Internal Medicine

## 2024-10-17 ENCOUNTER — Ambulatory Visit: Admitting: Physician Assistant
